# Patient Record
Sex: Female | Born: 1995 | Race: White | Hispanic: No | Marital: Single | Smoking: Smoker, current status unknown
Health system: Southern US, Community
[De-identification: ages and names within clinical notes are randomized; demographics above are authoritative.]

## PROBLEM LIST (undated history)

## (undated) HISTORY — PX: FOOT SURGERY: SHX648

## (undated) HISTORY — PX: TONSILLECTOMY: SUR1361

## (undated) HISTORY — PX: OTHER SURGICAL HISTORY: SHX169

## (undated) HISTORY — PX: WISDOM TOOTH EXTRACTION: SHX21

---

## 2018-11-02 ENCOUNTER — Other Ambulatory Visit (HOSPITAL_COMMUNITY)
Admission: RE | Admit: 2018-11-02 | Discharge: 2018-11-02 | Disposition: A | Discharge status: Home / Self Care | Payer: Medicaid Other | Source: Ambulatory Visit | Attending: Obstetrics and Gynecology | Admitting: Obstetrics and Gynecology

## 2018-11-02 ENCOUNTER — Ambulatory Visit (INDEPENDENT_AMBULATORY_CARE_PROVIDER_SITE_OTHER): Payer: Medicaid Other | Admitting: Obstetrics and Gynecology

## 2018-11-02 ENCOUNTER — Encounter: Payer: Self-pay | Admitting: Obstetrics and Gynecology

## 2018-11-02 DIAGNOSIS — Z23 Encounter for immunization: Secondary | ICD-10-CM | POA: Diagnosis not present

## 2018-11-02 DIAGNOSIS — Z3402 Encounter for supervision of normal first pregnancy, second trimester: Secondary | ICD-10-CM | POA: Diagnosis not present

## 2018-11-02 DIAGNOSIS — Z3490 Encounter for supervision of normal pregnancy, unspecified, unspecified trimester: Secondary | ICD-10-CM | POA: Insufficient documentation

## 2018-11-02 DIAGNOSIS — R413 Other amnesia: Secondary | ICD-10-CM

## 2018-11-02 DIAGNOSIS — Z87820 Personal history of traumatic brain injury: Secondary | ICD-10-CM | POA: Insufficient documentation

## 2018-11-02 NOTE — Patient Instructions (Signed)
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
## 2018-11-03 LAB — CERVICOVAGINAL ANCILLARY ONLY
BACTERIAL VAGINITIS: POSITIVE — AB
CANDIDA VAGINITIS: NEGATIVE
Chlamydia: NEGATIVE
NEISSERIA GONORRHEA: NEGATIVE
Trichomonas: NEGATIVE

## 2018-11-06 LAB — URINE CULTURE, OB REFLEX

## 2018-11-06 LAB — CULTURE, OB URINE

## 2018-11-08 ENCOUNTER — Encounter: Payer: Self-pay | Admitting: Obstetrics and Gynecology

## 2018-11-13 LAB — OBSTETRIC PANEL, INCLUDING HIV
Antibody Screen: NEGATIVE
BASOS ABS: 0 10*3/uL (ref 0.0–0.2)
Basos: 0 %
EOS (ABSOLUTE): 0.1 10*3/uL (ref 0.0–0.4)
Eos: 1 %
HEP B S AG: NEGATIVE
HIV Screen 4th Generation wRfx: NONREACTIVE
Hematocrit: 41 % (ref 34.0–46.6)
Hemoglobin: 13.7 g/dL (ref 11.1–15.9)
IMMATURE GRANULOCYTES: 0 %
Immature Grans (Abs): 0 10*3/uL (ref 0.0–0.1)
Lymphocytes Absolute: 1.9 10*3/uL (ref 0.7–3.1)
Lymphs: 16 %
MCH: 28.2 pg (ref 26.6–33.0)
MCHC: 33.4 g/dL (ref 31.5–35.7)
MCV: 85 fL (ref 79–97)
MONOS ABS: 0.5 10*3/uL (ref 0.1–0.9)
Monocytes: 4 %
NEUTROS PCT: 79 %
Neutrophils Absolute: 9.6 10*3/uL — ABNORMAL HIGH (ref 1.4–7.0)
PLATELETS: 294 10*3/uL (ref 150–450)
RBC: 4.85 x10E6/uL (ref 3.77–5.28)
RDW: 13 % (ref 12.3–15.4)
RPR: NONREACTIVE
Rh Factor: POSITIVE
Rubella Antibodies, IGG: <0.9 index — ABNORMAL LOW (ref >0.99)
WBC: 12.1 10*3/uL — AB (ref 3.4–10.8)

## 2018-11-13 LAB — AFP, SERUM, OPEN SPINA BIFIDA
AFP MoM: 0.53
AFP VALUE AFPOSL: 14.8 ng/mL
Gest. Age on Collection Date: 15 weeks
Maternal Age At EDD: 20.9 yr
OSBR Risk 1 IN: 10000
Test Results:: NEGATIVE
WEIGHT: 159 [lb_av]

## 2018-11-13 LAB — SMN1 COPY NUMBER ANALYSIS (SMA CARRIER SCREENING)

## 2018-11-13 LAB — HEMOGLOBINOPATHY EVALUATION
HGB C: 0 %
HGB S: 0 %
HGB VARIANT: 0 %
Hemoglobin A2 Quantitation: 2.3 % (ref 1.8–3.2)
Hemoglobin F Quantitation: 0 % (ref 0.0–2.0)
Hgb A: 97.7 % (ref 96.4–98.8)

## 2018-11-13 LAB — CYSTIC FIBROSIS MUTATION 97: Interpretation: NOT DETECTED

## 2018-11-24 ENCOUNTER — Encounter (HOSPITAL_COMMUNITY): Payer: Self-pay

## 2018-11-30 ENCOUNTER — Ambulatory Visit (INDEPENDENT_AMBULATORY_CARE_PROVIDER_SITE_OTHER): Payer: Medicaid Other | Admitting: Obstetrics & Gynecology

## 2018-11-30 DIAGNOSIS — Z3402 Encounter for supervision of normal first pregnancy, second trimester: Secondary | ICD-10-CM

## 2018-11-30 NOTE — Progress Notes (Signed)
Pt is here for ROB G1P0 [redacted]w[redacted]d.  

## 2018-11-30 NOTE — Patient Instructions (Signed)
Second Trimester of Pregnancy The second trimester is from week 13 through week 28, month 4 through 6. This is often the time in pregnancy that you feel your best. Often times, morning sickness has lessened or quit. You may have more energy, and you may get hungry more often. Your unborn baby (fetus) is growing rapidly. At the end of the sixth month, he or she is about 9 inches long and weighs about 1 pounds. You will likely feel the baby move (quickening) between 18 and 20 weeks of pregnancy. Follow these instructions at home:  Avoid all smoking, herbs, and alcohol. Avoid drugs not approved by your doctor.  Do not use any tobacco products, including cigarettes, chewing tobacco, and electronic cigarettes. If you need help quitting, ask your doctor. You may get counseling or other support to help you quit.  Only take medicine as told by your doctor. Some medicines are safe and some are not during pregnancy.  Exercise only as told by your doctor. Stop exercising if you start having cramps.  Eat regular, healthy meals.  Wear a good support bra if your breasts are tender.  Do not use hot tubs, steam rooms, or saunas.  Wear your seat belt when driving.  Avoid raw meat, uncooked cheese, and liter boxes and soil used by cats.  Take your prenatal vitamins.  Take 1500-2000 milligrams of calcium daily starting at the 20th week of pregnancy until you deliver your baby.  Try taking medicine that helps you poop (stool softener) as needed, and if your doctor approves. Eat more fiber by eating fresh fruit, vegetables, and whole grains. Drink enough fluids to keep your pee (urine) clear or pale yellow.  Take warm water baths (sitz baths) to soothe pain or discomfort caused by hemorrhoids. Use hemorrhoid cream if your doctor approves.  If you have puffy, bulging veins (varicose veins), wear support hose. Raise (elevate) your feet for 15 minutes, 3-4 times a day. Limit salt in your diet.  Avoid heavy  lifting, wear low heals, and sit up straight.  Rest with your legs raised if you have leg cramps or low back pain.  Visit your dentist if you have not gone during your pregnancy. Use a soft toothbrush to brush your teeth. Be gentle when you floss.  You can have sex (intercourse) unless your doctor tells you not to.  Go to your doctor visits. Get help if:  You feel dizzy.  You have mild cramps or pressure in your lower belly (abdomen).  You have a nagging pain in your belly area.  You continue to feel sick to your stomach (nauseous), throw up (vomit), or have watery poop (diarrhea).  You have bad smelling fluid coming from your vagina.  You have pain with peeing (urination). Get help right away if:  You have a fever.  You are leaking fluid from your vagina.  You have spotting or bleeding from your vagina.  You have severe belly cramping or pain.  You lose or gain weight rapidly.  You have trouble catching your breath and have chest pain.  You notice sudden or extreme puffiness (swelling) of your face, hands, ankles, feet, or legs.  You have not felt the baby move in over an hour.  You have severe headaches that do not go away with medicine.  You have vision changes. This information is not intended to replace advice given to you by your health care provider. Make sure you discuss any questions you have with your health care   provider. Document Released: 03/11/2010 Document Revised: 05/22/2016 Document Reviewed: 02/15/2013 Elsevier Interactive Patient Education  2017 [employer]  

## 2018-11-30 NOTE — Progress Notes (Signed)
   PRENATAL VISIT NOTE  Subjective:  Dominique Fuentes is a 22 y.o. G1P0 at 6147w0d being seen today for ongoing prenatal care.  She is currently monitored for the following issues for this low-risk pregnancy and has Supervision of normal pregnancy; History of traumatic brain injury; and Memory loss on their problem list.  Patient reports no complaints.  Contractions: Not present. Vag. Bleeding: None.  Movement: Absent. Denies leaking of fluid.   The following portions of the patient's history were reviewed and updated as appropriate: allergies, current medications, past family history, past medical history, past social history, past surgical history and problem list. Problem list updated.  Objective:   Vitals:   11/30/18 0918  BP: 120/84  Pulse: 98  Weight: 186 lb 3.2 oz (84.5 kg)    Fetal Status: Fetal Heart Rate (bpm): 149   Movement: Absent     General:  Alert, oriented and cooperative. Patient is in no acute distress.  Skin: Skin is warm and dry. No rash noted.   Cardiovascular: Normal heart rate noted  Respiratory: Normal respiratory effort, no problems with respiration noted  Abdomen: Soft, gravid, appropriate for gestational age.  Pain/Pressure: Absent     Pelvic: Cervical exam deferred        Extremities: Normal range of motion.  Edema: None  Mental Status: Normal mood and affect. Normal behavior. Normal judgment and thought content.   Assessment and Plan:  Pregnancy: G1P0 at 2347w0d  1. Encounter for supervision of normal first pregnancy in second trimester Normal prenatal testing, genetic studies  Preterm labor symptoms and general obstetric precautions including but not limited to vaginal bleeding, contractions, leaking of fluid and fetal movement were reviewed in detail with the patient. Please refer to After Visit Summary for other counseling recommendations.  Return in about 4 weeks (around 12/28/2018).  Future Appointments  Date Time Provider Department Center    12/02/2018 10:15 AM [facility] US 4 [facility]    Scheryl DarterJames Janille Draughon, MD

## 2018-12-02 ENCOUNTER — Other Ambulatory Visit (HOSPITAL_COMMUNITY): Payer: Self-pay | Admitting: *Deleted

## 2018-12-02 ENCOUNTER — Ambulatory Visit (HOSPITAL_COMMUNITY)
Admission: RE | Admit: 2018-12-02 | Discharge: 2018-12-02 | Disposition: A | Discharge status: Home / Self Care | Payer: Medicaid Other | Source: Ambulatory Visit | Attending: Obstetrics and Gynecology | Admitting: Obstetrics and Gynecology

## 2018-12-02 DIAGNOSIS — Z3A19 19 weeks gestation of pregnancy: Secondary | ICD-10-CM | POA: Diagnosis not present

## 2018-12-02 DIAGNOSIS — Z362 Encounter for other antenatal screening follow-up: Secondary | ICD-10-CM

## 2018-12-02 DIAGNOSIS — Z3402 Encounter for supervision of normal first pregnancy, second trimester: Secondary | ICD-10-CM

## 2018-12-02 DIAGNOSIS — Z363 Encounter for antenatal screening for malformations: Secondary | ICD-10-CM | POA: Diagnosis present

## 2018-12-27 ENCOUNTER — Other Ambulatory Visit: Payer: Self-pay

## 2018-12-27 ENCOUNTER — Ambulatory Visit (INDEPENDENT_AMBULATORY_CARE_PROVIDER_SITE_OTHER): Payer: Medicaid Other | Admitting: Obstetrics & Gynecology

## 2018-12-27 VITALS — BP 128/81 | HR 92 | Wt 189.0 lb

## 2018-12-27 DIAGNOSIS — Z3402 Encounter for supervision of normal first pregnancy, second trimester: Secondary | ICD-10-CM

## 2018-12-27 DIAGNOSIS — Z87820 Personal history of traumatic brain injury: Secondary | ICD-10-CM

## 2018-12-27 NOTE — Patient Instructions (Signed)

## 2018-12-27 NOTE — Progress Notes (Signed)
   PRENATAL VISIT NOTE  Subjective:  Dominique Fuentes is a 22 y.o. G1P0 at 64[place] Carp249w6d being seen today for ongoing prenatal care.  She is currently monitored for the following issues for this low-risk pregnancy and has Supervision of normal pregnancy; History of traumatic brain injury; and Memory loss on their problem list.  Patient reports no complaints.  Contractions: Not present. Vag. Bleeding: None.  Movement: Present. Denies leaking of fluid.   The following portions of the patient's history were reviewed and updated as appropriate: allergies, current medications, past family history, past medical history, past social history, past surgical history and problem list. Problem list updated.  Objective:   Vitals:   12/27/18 1022  BP: 128/81  Pulse: 92  Weight: 189 lb (85.7 kg)    Fetal Status: Fetal Heart Rate (bpm): 144   Movement: Present     General:  Alert, oriented and cooperative. Patient is in no acute distress.  Skin: Skin is warm and dry. No rash noted.   Cardiovascular: Normal heart rate noted  Respiratory: Normal respiratory effort, no problems with respiration noted  Abdomen: Soft, gravid, appropriate for gestational age.  Pain/Pressure: Absent     Pelvic: Cervical exam deferred        Extremities: Normal range of motion.  Edema: None  Mental Status: Normal mood and affect. Normal behavior. Normal judgment and thought content.   Assessment and Plan:  Pregnancy: G1P0 at 34249w6d  1. Encounter for supervision of normal first pregnancy in second trimester US reviewed  2. History of traumatic brain injury stable  Preterm labor symptoms and general obstetric precautions including but not limited to vaginal bleeding, contractions, leaking of fluid and fetal movement were reviewed in detail with the patient. Please refer to After Visit Summary for other counseling recommendations.  Return in about 4 weeks (around 01/24/2019) for 2 hr.  Future Appointments  Date Time Provider  Department Center  01/06/2019 10:15 AM [facility] US 4 [facility]  01/24/2019 10:30 AM Leftwich-Kirby, Wilmer FloorLisa A, CNM [facility] None    Scheryl DarterJames Arnold, MD

## 2019-01-06 ENCOUNTER — Ambulatory Visit (HOSPITAL_COMMUNITY)
Admission: RE | Admit: 2019-01-06 | Discharge: 2019-01-06 | Disposition: A | Discharge status: Home / Self Care | Payer: Medicaid Other | Source: Ambulatory Visit | Attending: Obstetrics and Gynecology | Admitting: Obstetrics and Gynecology

## 2019-01-06 ENCOUNTER — Other Ambulatory Visit (HOSPITAL_COMMUNITY): Payer: Self-pay | Admitting: *Deleted

## 2019-01-06 DIAGNOSIS — Z362 Encounter for other antenatal screening follow-up: Secondary | ICD-10-CM | POA: Diagnosis present

## 2019-01-06 DIAGNOSIS — Z3A24 24 weeks gestation of pregnancy: Secondary | ICD-10-CM

## 2019-01-06 DIAGNOSIS — O365920 Maternal care for other known or suspected poor fetal growth, second trimester, not applicable or unspecified: Secondary | ICD-10-CM

## 2019-01-24 ENCOUNTER — Ambulatory Visit (INDEPENDENT_AMBULATORY_CARE_PROVIDER_SITE_OTHER): Payer: Medicaid Other | Admitting: Advanced Practice Midwife

## 2019-01-24 VITALS — BP 137/88 | HR 108 | Wt 190.4 lb

## 2019-01-24 DIAGNOSIS — Z87820 Personal history of traumatic brain injury: Secondary | ICD-10-CM | POA: Diagnosis not present

## 2019-01-24 DIAGNOSIS — H538 Other visual disturbances: Secondary | ICD-10-CM | POA: Diagnosis not present

## 2019-01-24 DIAGNOSIS — Z3402 Encounter for supervision of normal first pregnancy, second trimester: Secondary | ICD-10-CM

## 2019-01-24 NOTE — Patient Instructions (Signed)
Third Trimester of Pregnancy The third trimester is from week 28 through week 40 (months 7 through 9). The third trimester is a time when the unborn baby (fetus) is growing rapidly. At the end of the ninth month, the fetus is about 20 inches in length and weighs 6-10 pounds. Body changes during your third trimester Your body will continue to go through many changes during pregnancy. The changes vary from woman to woman. During the third trimester:  Your weight will continue to increase. You can expect to gain 25-35 pounds (11-16 kg) by the end of the pregnancy.  You may begin to get stretch marks on your hips, abdomen, and breasts.  You may urinate more often because the fetus is moving lower into your pelvis and pressing on your bladder.  You may develop or continue to have heartburn. This is caused by increased hormones that slow down muscles in the digestive tract.  You may develop or continue to have constipation because increased hormones slow digestion and cause the muscles that push waste through your intestines to relax.  You may develop hemorrhoids. These are swollen veins (varicose veins) in the rectum that can itch or be painful.  You may develop swollen, bulging veins (varicose veins) in your legs.  You may have increased body aches in the pelvis, back, or thighs. This is due to weight gain and increased hormones that are relaxing your joints.  You may have changes in your hair. These can include thickening of your hair, rapid growth, and changes in texture. Some women also have hair loss during or after pregnancy, or hair that feels dry or thin. Your hair will most likely return to normal after your baby is born.  Your breasts will continue to grow and they will continue to become tender. A yellow fluid (colostrum) may leak from your breasts. This is the first milk you are producing for your baby.  Your belly button may stick out.  You may notice more swelling in your hands,  face, or ankles.  You may have increased tingling or numbness in your hands, arms, and legs. The skin on your belly may also feel numb.  You may feel short of breath because of your expanding uterus.  You may have more problems sleeping. This can be caused by the size of your belly, increased need to urinate, and an increase in your body's metabolism.  You may notice the fetus "dropping," or moving lower in your abdomen (lightening).  You may have increased vaginal discharge.  You may notice your joints feel loose and you may have pain around your pelvic bone. What to expect at prenatal visits You will have prenatal exams every 2 weeks until week 36. Then you will have weekly prenatal exams. During a routine prenatal visit:  You will be weighed to make sure you and the baby are growing normally.  Your blood pressure will be taken.  Your abdomen will be measured to track your baby's growth.  The fetal heartbeat will be listened to.  Any test results from the previous visit will be discussed.  You may have a cervical check near your due date to see if your cervix has softened or thinned (effaced).  You will be tested for Group B streptococcus. This happens between 35 and 37 weeks. Your health care provider may ask you:  What your birth plan is.  How you are feeling.  If you are feeling the baby move.  If you have had any abnormal   symptoms, such as leaking fluid, bleeding, severe headaches, or abdominal cramping.  If you are using any tobacco products, including cigarettes, chewing tobacco, and electronic cigarettes.  If you have any questions. Other tests or screenings that may be performed during your third trimester include:  Blood tests that check for low iron levels (anemia).  Fetal testing to check the health, activity level, and growth of the fetus. Testing is done if you have certain medical conditions or if there are problems during the pregnancy.  Nonstress test  (NST). This test checks the health of your baby to make sure there are no signs of problems, such as the baby not getting enough oxygen. During this test, a belt is placed around your belly. The baby is made to move, and its heart rate is monitored during movement. What is false labor? False labor is a condition in which you feel small, irregular tightenings of the muscles in the womb (contractions) that usually go away with rest, changing position, or drinking water. These are called Braxton Hicks contractions. Contractions may last for hours, days, or even weeks before true labor sets in. If contractions come at regular intervals, become more frequent, increase in intensity, or become painful, you should see your health care provider. What are the signs of labor?  Abdominal cramps.  Regular contractions that start at 10 minutes apart and become stronger and more frequent with time.  Contractions that start on the top of the uterus and spread down to the lower abdomen and back.  Increased pelvic pressure and dull back pain.  A watery or bloody mucus discharge that comes from the vagina.  Leaking of amniotic fluid. This is also known as your "water breaking." It could be a slow trickle or a gush. Let your health care provider know if it has a color or strange odor. If you have any of these signs, call your health care provider right away, even if it is before your due date. Follow these instructions at home: Medicines  Follow your health care provider's instructions regarding medicine use. Specific medicines may be either safe or unsafe to take during pregnancy.  Take a prenatal vitamin that contains at least 600 micrograms (mcg) of folic acid.  If you develop constipation, try taking a stool softener if your health care provider approves. Eating and drinking   Eat a balanced diet that includes fresh fruits and vegetables, whole grains, good sources of protein such as meat, eggs, or tofu,  and low-fat dairy. Your health care provider will help you determine the amount of weight gain that is right for you.  Avoid raw meat and uncooked cheese. These carry germs that can cause birth defects in the baby.  If you have low calcium intake from food, talk to your health care provider about whether you should take a daily calcium supplement.  Eat four or five small meals rather than three large meals a day.  Limit foods that are high in fat and processed sugars, such as fried and sweet foods.  To prevent constipation: ? Drink enough fluid to keep your urine clear or pale yellow. ? Eat foods that are high in fiber, such as fresh fruits and vegetables, whole grains, and beans. Activity  Exercise only as directed by your health care provider. Most women can continue their usual exercise routine during pregnancy. Try to exercise for 30 minutes at least 5 days a week. Stop exercising if you experience uterine contractions.  Avoid heavy lifting.  Do   not exercise in extreme heat or humidity, or at high altitudes.  Wear low-heel, comfortable shoes.  Practice good posture.  You may continue to have sex unless your health care provider tells you otherwise. Relieving pain and discomfort  Take frequent breaks and rest with your legs elevated if you have leg cramps or low back pain.  Take warm sitz baths to soothe any pain or discomfort caused by hemorrhoids. Use hemorrhoid cream if your health care provider approves.  Wear a good support bra to prevent discomfort from breast tenderness.  If you develop varicose veins: ? Wear support pantyhose or compression stockings as told by your healthcare provider. ? Elevate your feet for 15 minutes, 3-4 times a day. Prenatal care  Write down your questions. Take them to your prenatal visits.  Keep all your prenatal visits as told by your health care provider. This is important. Safety  Wear your seat belt at all times when driving.  Make  a list of emergency phone numbers, including numbers for family, friends, the hospital, and police and fire departments. General instructions  Avoid cat litter boxes and soil used by cats. These carry germs that can cause birth defects in the baby. If you have a cat, ask someone to clean the litter box for you.  Do not travel far distances unless it is absolutely necessary and only with the approval of your health care provider.  Do not use hot tubs, steam rooms, or saunas.  Do not drink alcohol.  Do not use any products that contain nicotine or tobacco, such as cigarettes and e-cigarettes. If you need help quitting, ask your health care provider.  Do not use any medicinal herbs or unprescribed drugs. These chemicals affect the formation and growth of the baby.  Do not douche or use tampons or scented sanitary pads.  Do not cross your legs for long periods of time.  To prepare for the arrival of your baby: ? Take prenatal classes to understand, practice, and ask questions about labor and delivery. ? Make a trial run to the hospital. ? Visit the hospital and tour the maternity area. ? Arrange for maternity or paternity leave through employers. ? Arrange for family and friends to take care of pets while you are in the hospital. ? Purchase a rear-facing car seat and make sure you know how to install it in your car. ? Pack your hospital bag. ? Prepare the baby's nursery. Make sure to remove all pillows and stuffed animals from the baby's crib to prevent suffocation.  Visit your dentist if you have not gone during your pregnancy. Use a soft toothbrush to brush your teeth and be gentle when you floss. Contact a health care provider if:  You are unsure if you are in labor or if your water has broken.  You become dizzy.  You have mild pelvic cramps, pelvic pressure, or nagging pain in your abdominal area.  You have lower back pain.  You have persistent nausea, vomiting, or  diarrhea.  You have an unusual or bad smelling vaginal discharge.  You have pain when you urinate. Get help right away if:  Your water breaks before 37 weeks.  You have regular contractions less than 5 minutes apart before 37 weeks.  You have a fever.  You are leaking fluid from your vagina.  You have spotting or bleeding from your vagina.  You have severe abdominal pain or cramping.  You have rapid weight loss or weight gain.  You have   shortness of breath with chest pain.  You notice sudden or extreme swelling of your face, hands, ankles, feet, or legs.  Your baby makes fewer than 10 movements in 2 hours.  You have severe headaches that do not go away when you take medicine.  You have vision changes. Summary  The third trimester is from week 28 through week 40, months 7 through 9. The third trimester is a time when the unborn baby (fetus) is growing rapidly.  During the third trimester, your discomfort may increase as you and your baby continue to gain weight. You may have abdominal, leg, and back pain, sleeping problems, and an increased need to urinate.  During the third trimester your breasts will keep growing and they will continue to become tender. A yellow fluid (colostrum) may leak from your breasts. This is the first milk you are producing for your baby.  False labor is a condition in which you feel small, irregular tightenings of the muscles in the womb (contractions) that eventually go away. These are called Braxton Hicks contractions. Contractions may last for hours, days, or even weeks before true labor sets in.  Signs of labor can include: abdominal cramps; regular contractions that start at 10 minutes apart and become stronger and more frequent with time; watery or bloody mucus discharge that comes from the vagina; increased pelvic pressure and dull back pain; and leaking of amniotic fluid. This information is not intended to replace advice given to you by your  health care provider. Make sure you discuss any questions you have with your health care provider. Document Released: 12/09/2001 Document Revised: 01/20/2017 Document Reviewed: 01/20/2017 Elsevier Interactive Patient Education  2019 [employer]  

## 2019-01-24 NOTE — Progress Notes (Signed)
   PRENATAL VISIT NOTE  Subjective:  Dominique Fuentes is a 23 y.o. G1P0 at [redacted]w[redacted]d being seen today for ongoing prenatal care.  She is currently monitored for the following issues for this low-risk pregnancy and has Supervision of normal pregnancy; History of traumatic brain injury; and Memory loss on their problem list.  Patient reports vision changes occasionally.  Contractions: Not present. Vag. Bleeding: None.  Movement: Present. Denies leaking of fluid.   The following portions of the patient's history were reviewed and updated as appropriate: allergies, current medications, past family history, past medical history, past social history, past surgical history and problem list. Problem list updated.  Objective:   Vitals:   01/24/19 1034  BP: 137/88  Pulse: (!) 108  Weight: 86.4 kg    Fetal Status:     Movement: Present     General:  Alert, oriented and cooperative. Patient is in no acute distress.  Skin: Skin is warm and dry. No rash noted.   Cardiovascular: HEART: normal rate, heart sounds, regular rhythm RESP: normal effort, lung sounds clear and equal bilaterally  Respiratory: Normal respiratory effort, no problems with respiration noted  Abdomen: Soft, gravid, appropriate for gestational age.  Pain/Pressure: Absent     Pelvic: Cervical exam deferred        Extremities: Normal range of motion.  Edema: None  Mental Status: Normal mood and affect. Normal behavior. Normal judgment and thought content.  Neurological - alert, oriented, normal speech, no focal findings or movement disorder noted, screening mental status exam normal, neck supple without rigidity, cranial nerves II through XII intact, DTR's normal and symmetric, motor and sensory grossly normal bilaterally, normal muscle tone, no tremors, strength 5/5  Assessment and Plan:  Pregnancy: G1P0 at [redacted]w[redacted]d  1. Encounter for supervision of normal first pregnancy in second trimester --Anticipatory guidance about next  visits/weeks of pregnancy given. --Pt fasting today for 2 hour but was scheduled too late in morning and test will run into lunch.  Discussed options with pt including 1 hour today with potential for 3 hour if needed or reschedule 2 hour.  Pt prefers to reschedule and do 2 hour at next OB visit at 28 weeks.  2. History of traumatic brain injury --Hx memory loss. Stable currently.  3.  Blurred vision --Episodes x 2 of vision changes while lying down on her phone in dark room.  No other visual changes. No h/a, or other associated symptoms. --BP wnl, but high normal, no hx HTN. --May be from lying supine, recommend side lying or sitting up with pillows. --Neuro exam wnl today, pt to let office know if persists.   Preterm labor symptoms and general obstetric precautions including but not limited to vaginal bleeding, contractions, leaking of fluid and fetal movement were reviewed in detail with the patient. Please refer to After Visit Summary for other counseling recommendations.  No follow-ups on file.  Future Appointments  Date Time Provider Department Center  02/03/2019 10:00 AM [facility] [place] 3 [facility]    Sharen Counter, CNM

## 2019-02-03 ENCOUNTER — Ambulatory Visit (HOSPITAL_COMMUNITY): Payer: Medicaid Other

## 2019-02-08 ENCOUNTER — Ambulatory Visit (HOSPITAL_COMMUNITY): Admission: RE | Admit: 2019-02-08 | Payer: Medicaid Other | Source: Ambulatory Visit

## 2019-02-08 ENCOUNTER — Other Ambulatory Visit: Payer: Medicaid Other

## 2019-02-08 ENCOUNTER — Encounter (HOSPITAL_COMMUNITY): Payer: Self-pay

## 2019-02-08 ENCOUNTER — Encounter: Payer: Medicaid Other | Admitting: Obstetrics & Gynecology

## 2019-02-10 ENCOUNTER — Encounter: Payer: Self-pay | Admitting: Obstetrics & Gynecology

## 2019-04-26 ENCOUNTER — Inpatient Hospital Stay (HOSPITAL_COMMUNITY)
Admission: AD | Admit: 2019-04-26 | Payer: Medicaid Other | Source: Home / Self Care | Admitting: Obstetrics and Gynecology

## 2019-07-19 ENCOUNTER — Encounter (HOSPITAL_COMMUNITY): Payer: Self-pay

## 2020-05-14 IMAGING — US US MFM OB COMP +14 WKS
1 series · 14 of 28 positions shown · non-contrast
Comparison: none

[Series 1: us mfm ob comp +14 wks · 71 acquisitions, 14 frames shown]
[im 3/71]
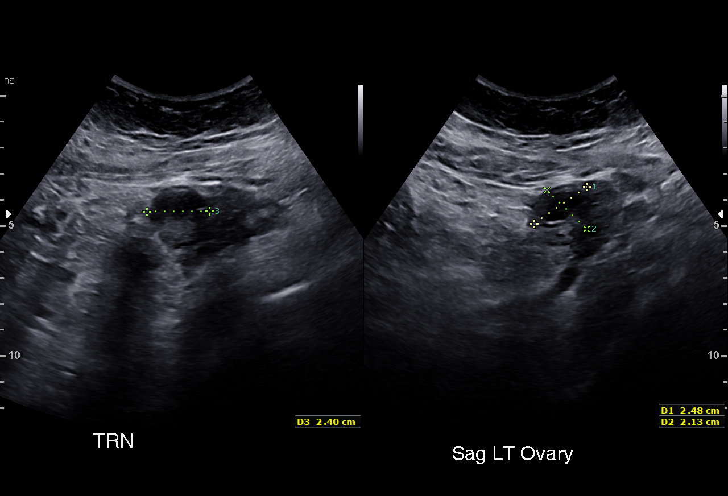
[im 8/71]
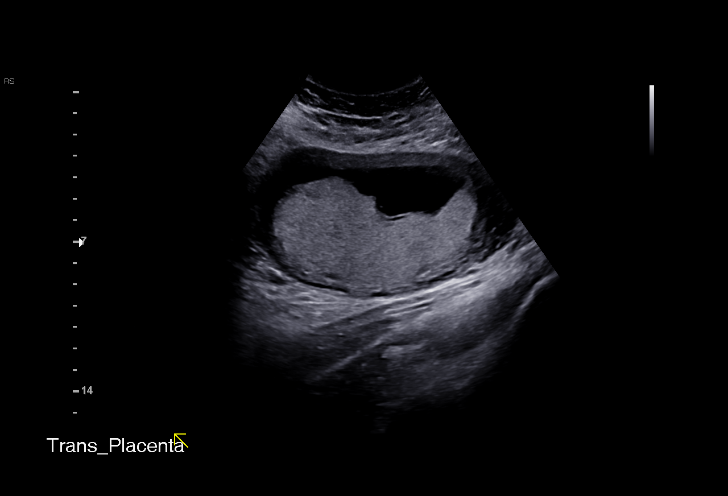
[im 13/71]
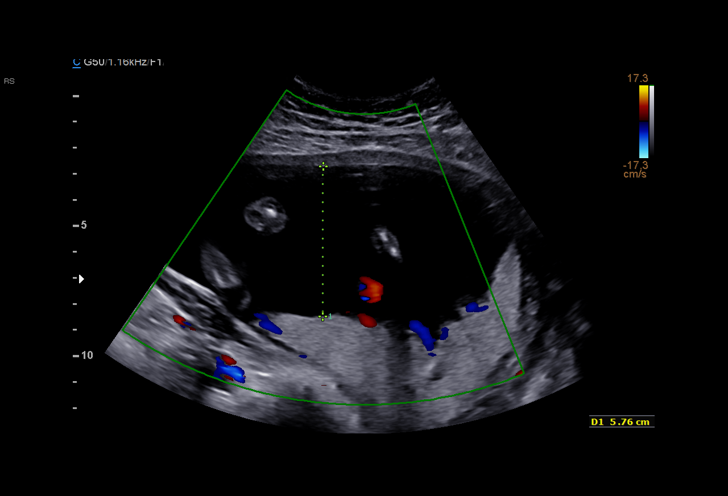
[im 19/71]
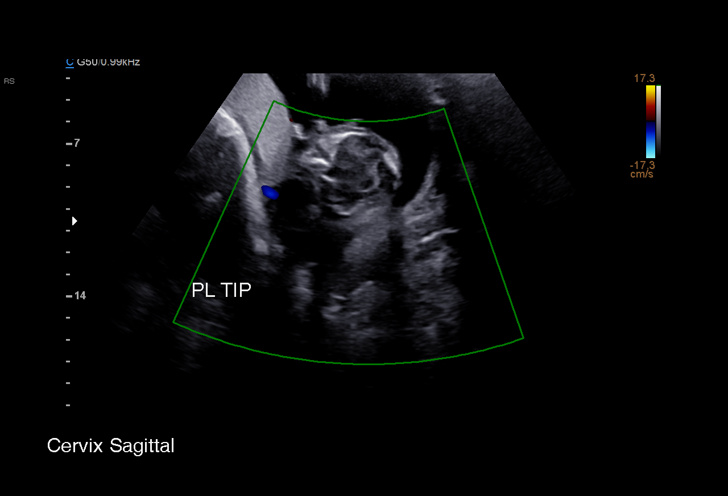
[im 24/71]
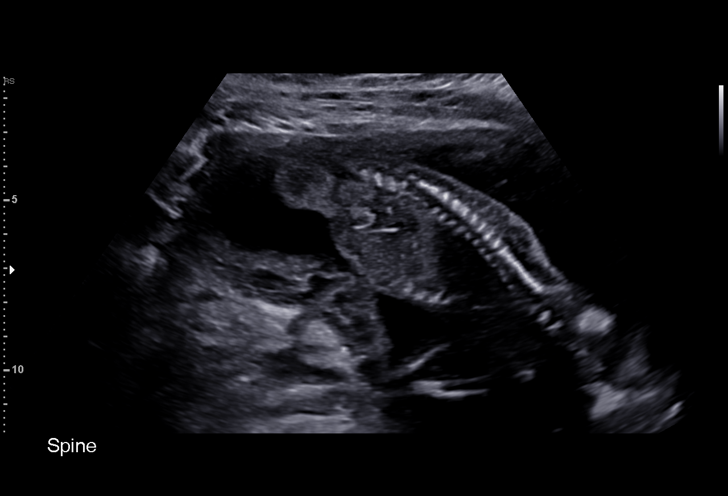
[im 29/71]
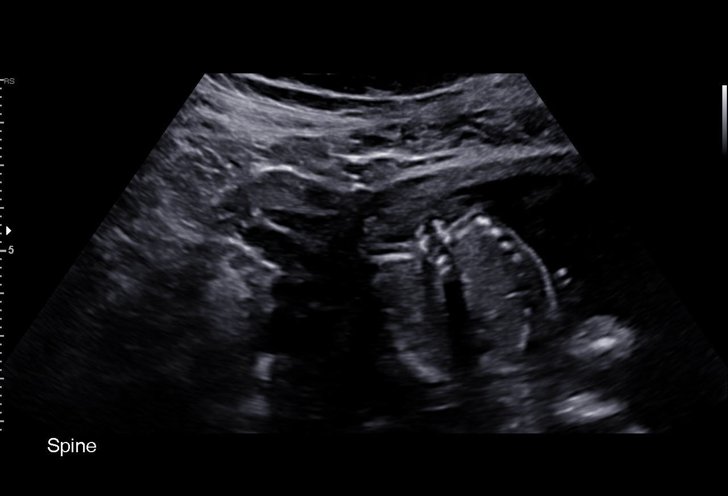
[im 34/71]
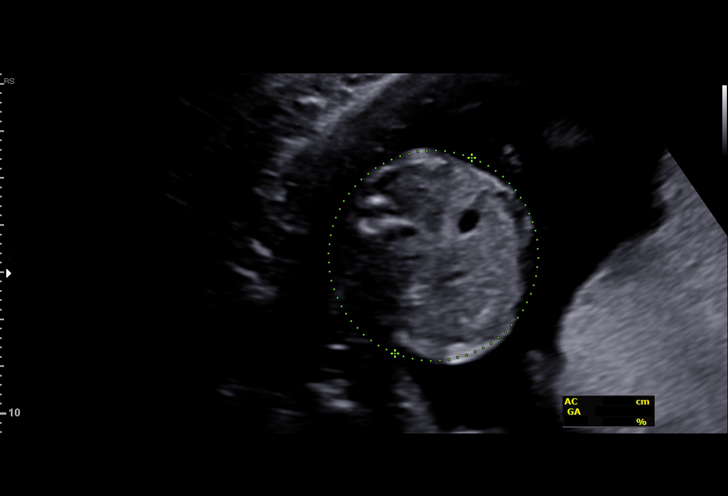
[im 39/71]
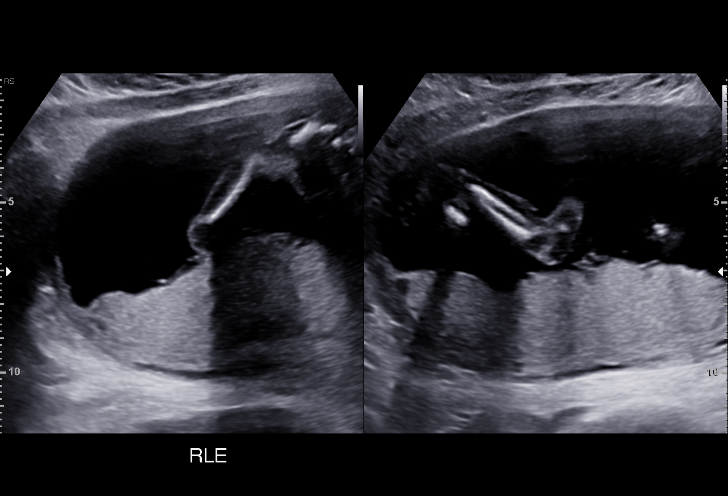
[im 45/71]
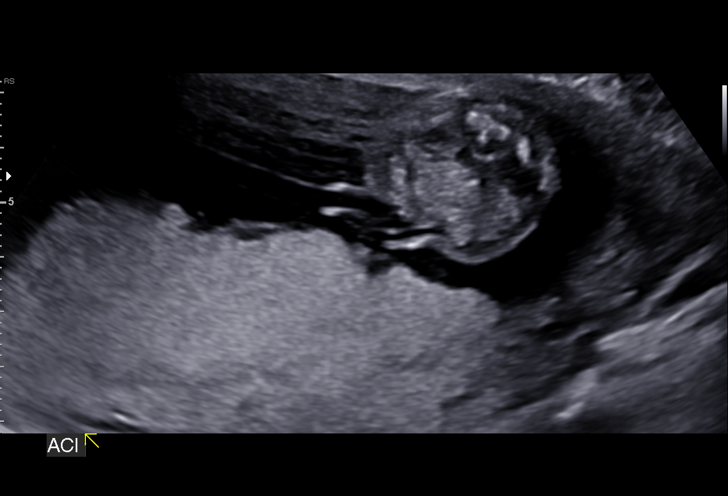
[im 50/71]
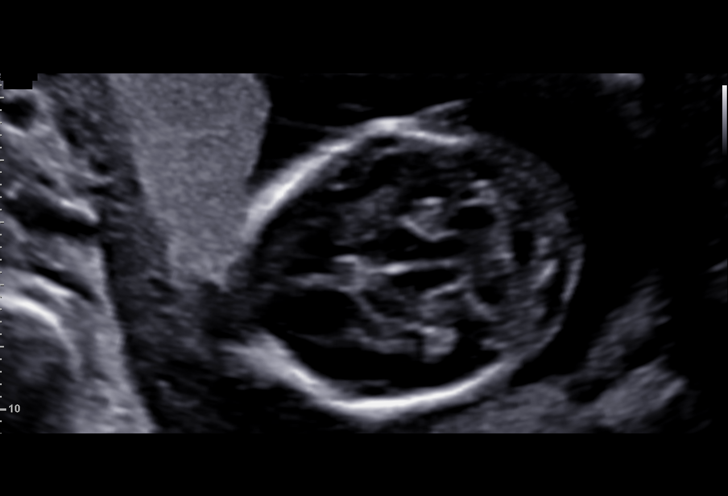
[im 55/71]
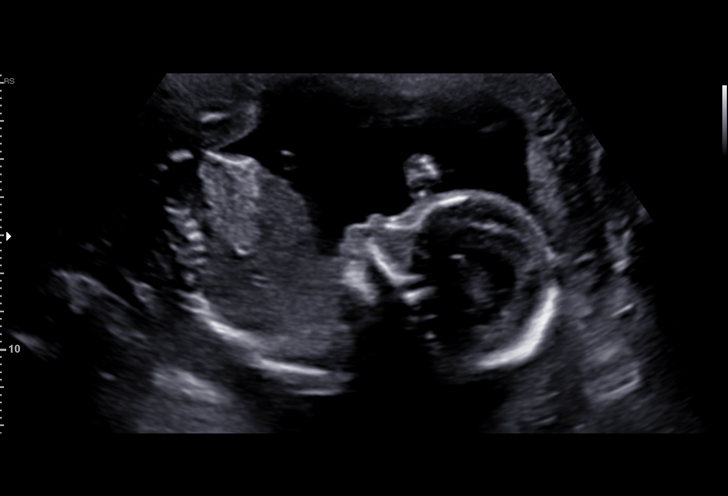
[im 60/71]
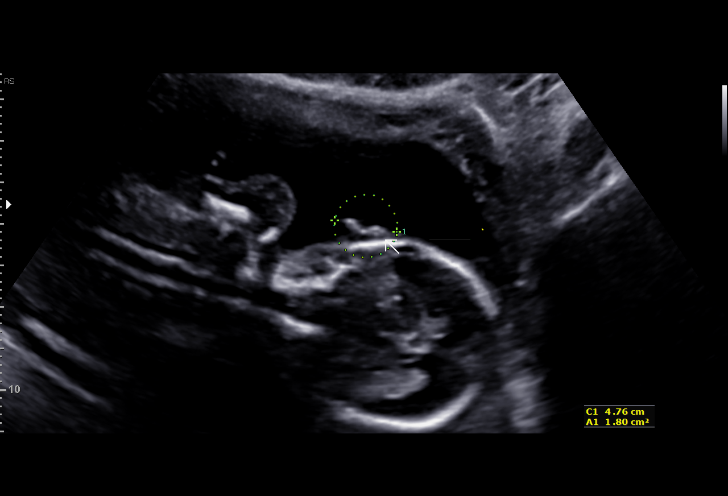
[im 65/71]
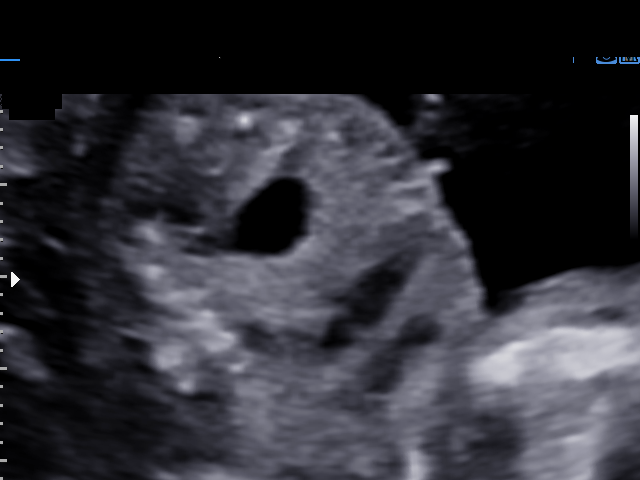
[im 71/71]
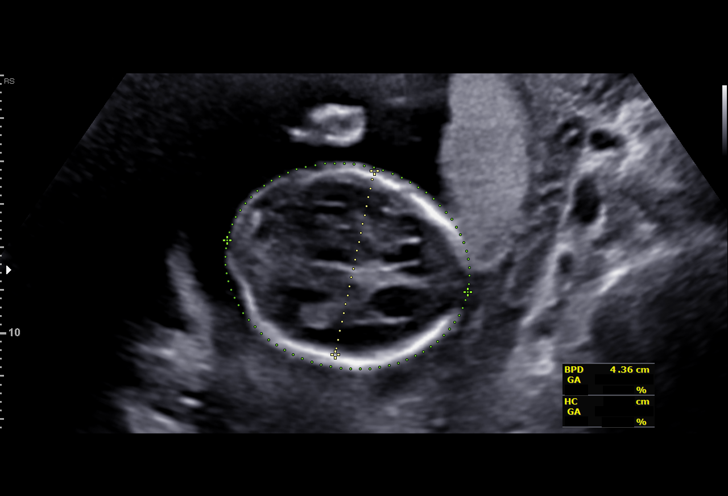

[14 of 28 positions shown; findings below may reference images not displayed]

[REDACTED]care - [HOSPITAL]

  1  US MFM OB COMP + 14 WK               76805.01     [facility]
 ----------------------------------------------------------------------

 ----------------------------------------------------------------------
Indications

  Encounter for antenatal screening for
  malformations
  19 weeks gestation of pregnancy
 ----------------------------------------------------------------------
Fetal Evaluation

 Num Of Fetuses:          1
 Fetal Heart Rate(bpm):   149
 Cardiac Activity:        Observed
 Presentation:            Cephalic
 Placenta:                Posterior
 P. Cord Insertion:       Visualized, central

 Amniotic Fluid
 AFI FV:      Within normal limits

                             Largest Pocket(cm)

Biometry

 BPD:      44.3  mm     G. Age:  19w 3d         55  %    CI:        73.33   %    70 - 86
                                                         FL/HC:       15.7  %    16.1 -
 HC:      164.4  mm     G. Age:  19w 1d         38  %    HC/AC:       1.19       1.09 -
 AC:      137.9  mm     G. Age:  19w 1d         43  %    FL/BPD:      58.2  %
 FL:       25.8  mm     G. Age:  17w 6d          6  %    FL/AC:       18.7  %    20 - 24
 HUM:      25.3  mm     G. Age:  18w 0d         13  %
 CER:      19.8  mm     G. Age:  18w 6d         42  %
 NFT:       4.6  mm
 CM:        5.1  mm
 Est. FW:     250   gm     0 lb 9 oz     33  %
OB History

 Gravidity:    1
Gestational Age

 LMP:           19w 2d        Date:  07/20/18                 EDD:   04/26/19
 U/S Today:     18w 6d                                        EDD:   04/29/19
 Best:          19w 2d     Det. By:  LMP  (07/20/18)          EDD:   04/26/19
Anatomy

 Cranium:               Appears normal         Aortic Arch:            Not well visualized
 Cavum:                 Appears normal         Ductal Arch:            Not well visualized
 Ventricles:            Appears normal         Diaphragm:              Appears normal
 Choroid Plexus:        Appears normal         Stomach:                Appears normal, left
                                                                       sided
 Cerebellum:            Appears normal         Abdomen:                Appears normal
 Posterior Fossa:       Appears normal         Abdominal Wall:         Appears nml (cord
                                                                       insert, abd wall)
 Nuchal Fold:           Appears normal         Cord Vessels:           Appears normal (3
                                                                       vessel cord)
 Face:                  Orbits nl; profile not Kidneys:                Appear normal
                        well visualized
 Lips:                  Not well visualized    Bladder:                Appears normal
 Thoracic:              Appears normal         Spine:                  Not well visualized
 Heart:                 Not well visualized    Upper Extremities:      Appears normal
 RVOT:                  Not well visualized    Lower Extremities:      Appears normal
 LVOT:                  Appears normal

 Other:  Heels visualized. Male gender. Lenses visualized. Technically difficult
         due to fetal position.
Cervix Uterus Adnexa

 Cervix
 Length:           3.05  cm.
 Normal appearance by transabdominal scan.

 Uterus
 No abnormality visualized.

 Left Ovary
 Within normal limits.

 Right Ovary
 Within normal limits.

 Adnexa
 No abnormality visualized. No adnexal mass
 visualized.
Impression

 Normal interval growth.  No ultrasonic evidence of structural
 fetal anomalies.
 Suboptimal views of the fetal heart and face
Recommendations

 Follow up in 5 weeks to clear the fetal anatomy.
